# Patient Record
Sex: Female | Born: 1965 | Race: White | Hispanic: No | Smoking: Never smoker
Health system: Southern US, Community
[De-identification: ages and names within clinical notes are randomized; demographics above are authoritative.]

## PROBLEM LIST (undated history)

## (undated) DIAGNOSIS — E785 Hyperlipidemia, unspecified: Secondary | ICD-10-CM

## (undated) DIAGNOSIS — F909 Attention-deficit hyperactivity disorder, unspecified type: Secondary | ICD-10-CM

## (undated) DIAGNOSIS — I10 Essential (primary) hypertension: Secondary | ICD-10-CM

## (undated) DIAGNOSIS — K219 Gastro-esophageal reflux disease without esophagitis: Secondary | ICD-10-CM

## (undated) DIAGNOSIS — F32A Depression, unspecified: Secondary | ICD-10-CM

## (undated) DIAGNOSIS — I219 Acute myocardial infarction, unspecified: Secondary | ICD-10-CM

## (undated) DIAGNOSIS — F5081 Binge eating disorder: Secondary | ICD-10-CM

## (undated) DIAGNOSIS — N6091 Unspecified benign mammary dysplasia of right breast: Secondary | ICD-10-CM

## (undated) DIAGNOSIS — G43909 Migraine, unspecified, not intractable, without status migrainosus: Secondary | ICD-10-CM

## (undated) HISTORY — DX: Depression, unspecified: F32.A

## (undated) HISTORY — DX: Hyperlipidemia, unspecified: E78.5

## (undated) HISTORY — DX: Attention-deficit hyperactivity disorder, unspecified type: F90.9

## (undated) HISTORY — PX: BREAST EXCISIONAL BIOPSY: SUR124

## (undated) HISTORY — DX: Binge eating disorder: F50.81

## (undated) HISTORY — DX: Gastro-esophageal reflux disease without esophagitis: K21.9

## (undated) HISTORY — DX: Essential (primary) hypertension: I10

## (undated) HISTORY — PX: ABDOMINAL HYSTERECTOMY: SHX81

## (undated) HISTORY — PX: BREAST LUMPECTOMY: SHX2

## (undated) HISTORY — PX: LAPAROSCOPIC HYSTERECTOMY: SHX1926

## (undated) HISTORY — DX: Acute myocardial infarction, unspecified: I21.9

---

## 2012-07-18 ENCOUNTER — Other Ambulatory Visit: Payer: Self-pay | Admitting: Obstetrics and Gynecology

## 2012-07-18 DIAGNOSIS — R928 Other abnormal and inconclusive findings on diagnostic imaging of breast: Secondary | ICD-10-CM

## 2012-07-29 ENCOUNTER — Other Ambulatory Visit: Payer: Self-pay | Admitting: Obstetrics and Gynecology

## 2012-07-29 ENCOUNTER — Ambulatory Visit
Admission: RE | Admit: 2012-07-29 | Discharge: 2012-07-29 | Disposition: A | Discharge status: Home / Self Care | Payer: No Typology Code available for payment source | Source: Ambulatory Visit | Attending: Obstetrics and Gynecology | Admitting: Obstetrics and Gynecology

## 2012-07-29 DIAGNOSIS — R928 Other abnormal and inconclusive findings on diagnostic imaging of breast: Secondary | ICD-10-CM

## 2013-05-01 ENCOUNTER — Ambulatory Visit (HOSPITAL_COMMUNITY)
Admission: RE | Admit: 2013-05-01 | Discharge: 2013-05-01 | Disposition: A | Discharge status: Home / Self Care | Payer: No Typology Code available for payment source | Source: Ambulatory Visit | Attending: Obstetrics and Gynecology | Admitting: Obstetrics and Gynecology

## 2013-05-01 ENCOUNTER — Other Ambulatory Visit (HOSPITAL_COMMUNITY): Payer: Self-pay | Admitting: Obstetrics and Gynecology

## 2013-05-01 DIAGNOSIS — R079 Chest pain, unspecified: Secondary | ICD-10-CM

## 2013-05-01 DIAGNOSIS — R0602 Shortness of breath: Secondary | ICD-10-CM

## 2013-05-01 DIAGNOSIS — J984 Other disorders of lung: Secondary | ICD-10-CM | POA: Insufficient documentation

## 2013-05-01 DIAGNOSIS — Z90710 Acquired absence of both cervix and uterus: Secondary | ICD-10-CM | POA: Insufficient documentation

## 2013-05-01 DIAGNOSIS — J9819 Other pulmonary collapse: Secondary | ICD-10-CM | POA: Insufficient documentation

## 2015-02-17 HISTORY — PX: COLONOSCOPY: SHX174

## 2015-05-16 ENCOUNTER — Other Ambulatory Visit: Payer: Self-pay | Admitting: Obstetrics and Gynecology

## 2015-05-16 DIAGNOSIS — R928 Other abnormal and inconclusive findings on diagnostic imaging of breast: Secondary | ICD-10-CM

## 2015-05-31 ENCOUNTER — Ambulatory Visit
Admission: RE | Admit: 2015-05-31 | Discharge: 2015-05-31 | Disposition: A | Discharge status: Home / Self Care | Payer: BLUE CROSS/BLUE SHIELD | Source: Ambulatory Visit | Attending: Obstetrics and Gynecology | Admitting: Obstetrics and Gynecology

## 2015-05-31 ENCOUNTER — Other Ambulatory Visit: Payer: Self-pay | Admitting: Obstetrics and Gynecology

## 2015-05-31 DIAGNOSIS — R928 Other abnormal and inconclusive findings on diagnostic imaging of breast: Secondary | ICD-10-CM

## 2015-06-11 ENCOUNTER — Other Ambulatory Visit: Payer: BLUE CROSS/BLUE SHIELD

## 2015-06-12 ENCOUNTER — Other Ambulatory Visit: Payer: Self-pay | Admitting: Obstetrics and Gynecology

## 2015-06-12 ENCOUNTER — Ambulatory Visit
Admission: RE | Admit: 2015-06-12 | Discharge: 2015-06-12 | Disposition: A | Discharge status: Home / Self Care | Payer: BLUE CROSS/BLUE SHIELD | Source: Ambulatory Visit | Attending: Obstetrics and Gynecology | Admitting: Obstetrics and Gynecology

## 2015-06-12 DIAGNOSIS — R928 Other abnormal and inconclusive findings on diagnostic imaging of breast: Secondary | ICD-10-CM

## 2015-06-17 DIAGNOSIS — N6091 Unspecified benign mammary dysplasia of right breast: Secondary | ICD-10-CM

## 2015-06-17 HISTORY — DX: Unspecified benign mammary dysplasia of right breast: N60.91

## 2015-06-25 ENCOUNTER — Other Ambulatory Visit: Payer: Self-pay | Admitting: Surgery

## 2015-06-25 DIAGNOSIS — N6091 Unspecified benign mammary dysplasia of right breast: Secondary | ICD-10-CM

## 2015-06-26 ENCOUNTER — Other Ambulatory Visit: Payer: Self-pay | Admitting: Surgery

## 2015-06-26 DIAGNOSIS — N6091 Unspecified benign mammary dysplasia of right breast: Secondary | ICD-10-CM

## 2015-07-12 ENCOUNTER — Encounter (HOSPITAL_BASED_OUTPATIENT_CLINIC_OR_DEPARTMENT_OTHER): Payer: Self-pay | Admitting: *Deleted

## 2015-07-17 ENCOUNTER — Ambulatory Visit
Admission: RE | Admit: 2015-07-17 | Discharge: 2015-07-17 | Disposition: A | Discharge status: Home / Self Care | Payer: BLUE CROSS/BLUE SHIELD | Source: Ambulatory Visit | Attending: Surgery | Admitting: Surgery

## 2015-07-17 DIAGNOSIS — N6091 Unspecified benign mammary dysplasia of right breast: Secondary | ICD-10-CM

## 2015-07-17 NOTE — Progress Notes (Signed)
Pt given 8oz orange flavored Boost drink per protocol. Written and verbal instruction provided. Pt to complete drink by 0415 on DOS. Pt verbalized understanding.

## 2015-07-18 NOTE — Anesthesia Preprocedure Evaluation (Addendum)
Anesthesia Evaluation  Patient identified by MRN, date of birth, ID band Patient awake    Reviewed: Allergy & Precautions, NPO status , Patient's Chart, lab work & pertinent test results  Airway Mallampati: II  TM Distance: >3 FB Neck ROM: Full    Dental no notable dental hx.    Pulmonary neg pulmonary ROS,    Pulmonary exam normal breath sounds clear to auscultation       Cardiovascular negative cardio ROS Normal cardiovascular exam Rhythm:Regular Rate:Normal     Neuro/Psych negative neurological ROS  negative psych ROS   GI/Hepatic negative GI ROS, Neg liver ROS,   Endo/Other  negative endocrine ROS  Renal/GU negative Renal ROS  negative genitourinary   Musculoskeletal negative musculoskeletal ROS (+)   Abdominal   Peds negative pediatric ROS (+)  Hematology negative hematology ROS (+)   Anesthesia Other Findings   Reproductive/Obstetrics negative OB ROS                             Anesthesia Physical Anesthesia Plan  ASA: I  Anesthesia Plan: General   Post-op Pain Management:    Induction: Intravenous  Airway Management Planned: LMA  Additional Equipment:   Intra-op Plan:   Post-operative Plan: Extubation in OR  Informed Consent: I have reviewed the patients History and Physical, chart, labs and discussed the procedure including the risks, benefits and alternatives for the proposed anesthesia with the patient or authorized representative who has indicated his/her understanding and acceptance.   Dental advisory given  Plan Discussed with: CRNA and Surgeon  Anesthesia Plan Comments:         Anesthesia Quick Evaluation  

## 2015-07-18 NOTE — H&P (Signed)
Miranda Huff 06/25/2015 2:44 PM Location: [facility] Patient #: [id number] DOB: 03/27/1965 Single / Language: Cleophus Molt / Race: White Female   History of Present Illness (Alize Borrayo A. Ninfa Linden MD; 06/25/2015 2:59 PM) Patient words: New-breast.  The patient is a 50 year old female who presents with a complaint of Breast problems. This is a pleasant female referred by Dr. Marin Olp after the recent diagnosis of atypical lobular hyperplasia of the right breast. This was found on recent screening mammography. She actually had a small mass which the radiologist could palpate. Mammogram and ultrasound were performed as well as a biopsy showing atypical lobular hyperplasia. She has not been able to palpate the mass herself. She has not had any previous breast biopsy. She denies nipple discharge. There is no family history of breast cancer. She is otherwise healthy and without complaints.   Other Problems Malachi Bonds, CMA; 06/25/2015 2:44 PM) High blood pressure Kidney Stone Migraine Headache  Past Surgical History Malachi Bonds, CMA; 06/25/2015 2:44 PM) Breast Biopsy Right. Hysterectomy (not due to cancer) - Partial Oral Surgery  Diagnostic Studies History Malachi Bonds, CMA; 06/25/2015 2:44 PM) Colonoscopy never Mammogram within last year Pap Smear 1-5 years ago  Allergies Malachi Bonds, CMA; 06/25/2015 2:45 PM) No Known Drug Allergies05/10/2015  Medication History Malachi Bonds, CMA; 06/25/2015 2:45 PM) Topiramate (25MG  Tablet, Oral) Active. Medications Reconciled  Social History Malachi Bonds, CMA; 06/25/2015 2:44 PM) Alcohol use Remotely quit alcohol use. Caffeine use Coffee. No drug use Tobacco use Never smoker.  Family History Malachi Bonds, [place]; 06/25/2015 2:44 PM) Alcohol Abuse Sister. Cancer Mother. Depression Brother, Mother, Sister. Hypertension Brother, Mother, Sister. Migraine Headache Son. Thyroid problems  Mother.  Pregnancy / Birth History Malachi Bonds, CMA; 06/25/2015 2:44 PM) Age at menarche 34 years. Age of menopause 35-50 Gravida 2 Irregular periods Maternal age 24-20 Para 2    Review of Systems Malachi Bonds CMA; 06/25/2015 2:44 PM) General Present- Night Sweats. Not Present- Appetite Loss, Chills, Fatigue, Fever, Weight Gain and Weight Loss. Skin Not Present- Change in Wart/Mole, Dryness, Hives, Jaundice, New Lesions, Non-Healing Wounds, Rash and Ulcer. HEENT Not Present- Earache, Hearing Loss, Hoarseness, Nose Bleed, Oral Ulcers, Ringing in the Ears, Seasonal Allergies, Sinus Pain, Sore Throat, Visual Disturbances, Wears glasses/contact lenses and Yellow Eyes. Respiratory Not Present- Bloody sputum, Chronic Cough, Difficulty Breathing, Snoring and Wheezing. Breast Present- Breast Mass. Not Present- Breast Pain, Nipple Discharge and Skin Changes. Cardiovascular Not Present- Chest Pain, Difficulty Breathing Lying Down, Leg Cramps, Palpitations, Rapid Heart Rate, Shortness of Breath and Swelling of Extremities. Gastrointestinal Not Present- Abdominal Pain, Bloating, Bloody Stool, Change in Bowel Habits, Chronic diarrhea, Constipation, Difficulty Swallowing, Excessive gas, Gets full quickly at meals, Hemorrhoids, Indigestion, Nausea, Rectal Pain and Vomiting. Female Genitourinary Not Present- Frequency, Nocturia, Painful Urination, Pelvic Pain and Urgency. Musculoskeletal Not Present- Back Pain, Joint Pain, Joint Stiffness, Muscle Pain, Muscle Weakness and Swelling of Extremities. Neurological Not Present- Decreased Memory, Fainting, Headaches, Numbness, Seizures, Tingling, Tremor, Trouble walking and Weakness. Psychiatric Not Present- Anxiety, Bipolar, Change in Sleep Pattern, Depression, Fearful and Frequent crying. Endocrine Present- Hot flashes. Not Present- Cold Intolerance, Excessive Hunger, Hair Changes, Heat Intolerance and New Diabetes. Hematology Not Present- Easy Bruising,  Excessive bleeding, Gland problems, HIV and Persistent Infections.  Vitals (Chemira Jones CMA; 06/25/2015 2:45 PM) 06/25/2015 2:44 PM Weight: 157.2 lb Height: 66in Body Surface Area: 1.81 m Body Mass Index: 25.37 kg/m  Temp.: 98.66F(Oral)  Pulse: 82 (Regular)  BP: 150/90 (Sitting, Left Arm, Standard)  Physical Exam (Somer Trotter A. Ninfa Linden MD; 06/25/2015 3:00 PM) General Mental Status-Alert. General Appearance-Consistent with stated age. Hydration-Well hydrated. Voice-Normal.  Head and Neck Head-normocephalic, atraumatic with no lesions or palpable masses. Trachea-midline. Thyroid Gland Characteristics - normal size and consistency.  Eye Eyeball - Bilateral-Extraocular movements intact. Sclera/Conjunctiva - Bilateral-No scleral icterus.  Chest and Lung Exam Chest and lung exam reveals -quiet, even and easy respiratory effort with no use of accessory muscles and on auscultation, normal breath sounds, no adventitious sounds and normal vocal resonance. Inspection Chest Wall - Normal. Back - normal.  Breast Breast - Left-Symmetric, No Dimpling, No Inflammation, No Lumpectomy scars, No Mastectomy scars, No Peau d' Orange. Breast - Right-Symmetric and Biopsy scar, Non Tender, No Dimpling, No Inflammation, No Lumpectomy scars, No Mastectomy scars, No Peau d' Orange. Note: There is bruising of the right breast at the biopsy site. I cannot palpate the breast mass at the 10 o'clock position. Breast Lump-No Palpable Breast Mass.  Cardiovascular Cardiovascular examination reveals -normal heart sounds, regular rate and rhythm with no murmurs and normal pedal pulses bilaterally.  Abdomen Inspection Inspection of the abdomen reveals - No Hernias. Skin - Scar - no surgical scars. Palpation/Percussion Palpation and Percussion of the abdomen reveal - Soft, Non Tender, No Rebound tenderness, No Rigidity (guarding) and No  hepatosplenomegaly. Auscultation Auscultation of the abdomen reveals - Bowel sounds normal.  Neurologic - Did not examine.  Musculoskeletal Normal Exam - Left-Upper Extremity Strength Normal and Lower Extremity Strength Normal. Normal Exam - Right-Upper Extremity Strength Normal and Lower Extremity Strength Normal.  Lymphatic Head & Neck  General Head & Neck Lymphatics: Bilateral - Description - Normal. Axillary  General Axillary Region: Bilateral - Description - Normal. Tenderness - Non Tender. Femoral & Inguinal  Generalized Femoral & Inguinal Lymphatics: Bilateral - Description - Normal. Tenderness - Non Tender.    Assessment & Plan (Tetsuo Coppola A. Ninfa Linden MD; 06/25/2015 3:01 PM) ATYPICAL LOBULAR HYPERPLASIA OF RIGHT BREAST (N60.91) Impression: I discussed the pathology with her and gave her a copy of the pathology report. Radioactive seed right breast lumpectomy is recommended for complete histologic evaluation to rule out malignancy. I discussed the reasons for this with her in detail. I discussed the risks of surgery which includes but is not limited to bleeding, infection, need for further surgery if malignancy is found, etc. I also discussed postoperative recovery. She understands and wishes to proceed with surgery which will be scheduled

## 2015-07-19 ENCOUNTER — Ambulatory Visit
Admission: RE | Admit: 2015-07-19 | Discharge: 2015-07-19 | Disposition: A | Discharge status: Home / Self Care | Payer: BLUE CROSS/BLUE SHIELD | Source: Ambulatory Visit | Attending: Surgery | Admitting: Surgery

## 2015-07-19 ENCOUNTER — Ambulatory Visit (HOSPITAL_BASED_OUTPATIENT_CLINIC_OR_DEPARTMENT_OTHER): Payer: BLUE CROSS/BLUE SHIELD | Admitting: Anesthesiology

## 2015-07-19 ENCOUNTER — Encounter (HOSPITAL_BASED_OUTPATIENT_CLINIC_OR_DEPARTMENT_OTHER): Payer: Self-pay | Admitting: Certified Registered"

## 2015-07-19 ENCOUNTER — Encounter (HOSPITAL_BASED_OUTPATIENT_CLINIC_OR_DEPARTMENT_OTHER)
Admission: RE | Disposition: A | Discharge status: Home / Self Care | Payer: Self-pay | Source: Ambulatory Visit | Attending: Surgery

## 2015-07-19 ENCOUNTER — Ambulatory Visit (HOSPITAL_BASED_OUTPATIENT_CLINIC_OR_DEPARTMENT_OTHER)
Admission: RE | Admit: 2015-07-19 | Discharge: 2015-07-19 | Disposition: A | Discharge status: Home / Self Care | Payer: BLUE CROSS/BLUE SHIELD | Source: Ambulatory Visit | Attending: Surgery | Admitting: Surgery

## 2015-07-19 DIAGNOSIS — N62 Hypertrophy of breast: Secondary | ICD-10-CM | POA: Diagnosis not present

## 2015-07-19 DIAGNOSIS — N6091 Unspecified benign mammary dysplasia of right breast: Secondary | ICD-10-CM

## 2015-07-19 HISTORY — DX: Migraine, unspecified, not intractable, without status migrainosus: G43.909

## 2015-07-19 HISTORY — PX: BREAST LUMPECTOMY WITH RADIOACTIVE SEED LOCALIZATION: SHX6424

## 2015-07-19 HISTORY — DX: Unspecified benign mammary dysplasia of right breast: N60.91

## 2015-07-19 SURGERY — BREAST LUMPECTOMY WITH RADIOACTIVE SEED LOCALIZATION
Anesthesia: General | Site: Breast | Laterality: Right

## 2015-07-19 MED ORDER — FENTANYL CITRATE (PF) 100 MCG/2ML IJ SOLN
25.0000 ug | INTRAMUSCULAR | Status: DC | PRN
  Administered 2015-07-19 (×2): 50 ug via INTRAVENOUS

## 2015-07-19 MED ORDER — GLYCOPYRROLATE 0.2 MG/ML IJ SOLN: 0.2000 mg | Freq: Once | INTRAMUSCULAR | Status: DC | PRN

## 2015-07-19 MED ORDER — MORPHINE SULFATE (PF) 2 MG/ML IV SOLN: 1.0000 mg | INTRAVENOUS | Status: DC | PRN

## 2015-07-19 MED ORDER — PROPOFOL 500 MG/50ML IV EMUL
INTRAVENOUS | Status: AC
  Filled 2015-07-19: qty 50

## 2015-07-19 MED ORDER — KETOROLAC TROMETHAMINE 30 MG/ML IJ SOLN
30.0000 mg | Freq: Once | INTRAMUSCULAR | Status: DC | PRN
Start: 2015-07-19 — End: 2015-07-19

## 2015-07-19 MED ORDER — DEXAMETHASONE SODIUM PHOSPHATE 4 MG/ML IJ SOLN
INTRAMUSCULAR | Status: DC | PRN
  Administered 2015-07-19: 10 mg via INTRAVENOUS

## 2015-07-19 MED ORDER — BUPIVACAINE-EPINEPHRINE (PF) 0.5% -1:200000 IJ SOLN
INTRAMUSCULAR | Status: AC
  Filled 2015-07-19: qty 30

## 2015-07-19 MED ORDER — ACETAMINOPHEN 650 MG RE SUPP: 650.0000 mg | RECTAL | Status: DC | PRN

## 2015-07-19 MED ORDER — MIDAZOLAM HCL 2 MG/2ML IJ SOLN
1.0000 mg | INTRAMUSCULAR | Status: DC | PRN
  Administered 2015-07-19: 2 mg via INTRAVENOUS

## 2015-07-19 MED ORDER — BUPIVACAINE HCL (PF) 0.25 % IJ SOLN
INTRAMUSCULAR | Status: AC
  Filled 2015-07-19: qty 30

## 2015-07-19 MED ORDER — BUPIVACAINE-EPINEPHRINE 0.5% -1:200000 IJ SOLN
INTRAMUSCULAR | Status: DC | PRN
  Administered 2015-07-19: 20 mL

## 2015-07-19 MED ORDER — METHYLENE BLUE 0.5 % INJ SOLN
INTRAVENOUS | Status: AC
  Filled 2015-07-19: qty 10

## 2015-07-19 MED ORDER — LIDOCAINE HCL (CARDIAC) 20 MG/ML IV SOLN
INTRAVENOUS | Status: DC | PRN
  Administered 2015-07-19: 60 mg via INTRAVENOUS

## 2015-07-19 MED ORDER — SODIUM CHLORIDE 0.9 % IJ SOLN
INTRAMUSCULAR | Status: AC
  Filled 2015-07-19: qty 10

## 2015-07-19 MED ORDER — ONDANSETRON HCL 4 MG/2ML IJ SOLN
INTRAMUSCULAR | Status: AC
  Filled 2015-07-19: qty 2

## 2015-07-19 MED ORDER — PROMETHAZINE HCL 25 MG/ML IJ SOLN: 6.2500 mg | INTRAMUSCULAR | Status: DC | PRN

## 2015-07-19 MED ORDER — LIDOCAINE 2% (20 MG/ML) 5 ML SYRINGE
INTRAMUSCULAR | Status: AC
  Filled 2015-07-19: qty 5

## 2015-07-19 MED ORDER — ONDANSETRON HCL 4 MG/2ML IJ SOLN
INTRAMUSCULAR | Status: DC | PRN
  Administered 2015-07-19: 4 mg via INTRAVENOUS

## 2015-07-19 MED ORDER — SODIUM CHLORIDE 0.9% FLUSH: 3.0000 mL | INTRAVENOUS | Status: DC | PRN

## 2015-07-19 MED ORDER — SODIUM CHLORIDE 0.9 % IV SOLN: 250.0000 mL | INTRAVENOUS | Status: DC | PRN

## 2015-07-19 MED ORDER — FENTANYL CITRATE (PF) 100 MCG/2ML IJ SOLN
INTRAMUSCULAR | Status: AC
  Filled 2015-07-19: qty 2

## 2015-07-19 MED ORDER — OXYCODONE-ACETAMINOPHEN 5-325 MG PO TABS: 1.0000 | ORAL_TABLET | ORAL | Status: AC | PRN

## 2015-07-19 MED ORDER — MIDAZOLAM HCL 2 MG/2ML IJ SOLN
INTRAMUSCULAR | Status: AC
  Filled 2015-07-19: qty 2

## 2015-07-19 MED ORDER — LACTATED RINGERS IV SOLN
INTRAVENOUS | Status: DC
  Administered 2015-07-19 (×2): via INTRAVENOUS

## 2015-07-19 MED ORDER — ACETAMINOPHEN 325 MG PO TABS: 650.0000 mg | ORAL_TABLET | ORAL | Status: DC | PRN

## 2015-07-19 MED ORDER — SODIUM CHLORIDE 0.9% FLUSH
3.0000 mL | Freq: Two times a day (BID) | INTRAVENOUS | Status: DC
Start: 2015-07-19 — End: 2015-07-19

## 2015-07-19 MED ORDER — DEXAMETHASONE SODIUM PHOSPHATE 10 MG/ML IJ SOLN
INTRAMUSCULAR | Status: AC
  Filled 2015-07-19: qty 1

## 2015-07-19 MED ORDER — CEFAZOLIN SODIUM-DEXTROSE 2-4 GM/100ML-% IV SOLN
INTRAVENOUS | Status: AC
Start: 2015-07-19 — End: 2015-07-19
  Filled 2015-07-19: qty 100

## 2015-07-19 MED ORDER — CEFAZOLIN SODIUM-DEXTROSE 2-4 GM/100ML-% IV SOLN
2.0000 g | INTRAVENOUS | Status: AC
  Administered 2015-07-19: 2 g via INTRAVENOUS

## 2015-07-19 MED ORDER — PROPOFOL 10 MG/ML IV BOLUS
INTRAVENOUS | Status: DC | PRN
  Administered 2015-07-19: 200 mg via INTRAVENOUS

## 2015-07-19 MED ORDER — FENTANYL CITRATE (PF) 100 MCG/2ML IJ SOLN
50.0000 ug | INTRAMUSCULAR | Status: DC | PRN
  Administered 2015-07-19 (×2): 50 ug via INTRAVENOUS

## 2015-07-19 MED ORDER — OXYCODONE HCL 5 MG PO TABS: 5.0000 mg | ORAL_TABLET | ORAL | Status: DC | PRN

## 2015-07-19 MED ORDER — SCOPOLAMINE 1 MG/3DAYS TD PT72: 1.0000 | MEDICATED_PATCH | Freq: Once | TRANSDERMAL | Status: DC | PRN

## 2015-07-19 MED FILL — OXYCODONE/APAP 5-325: 5-325 | 3 days supply | Qty: 30 | Fill #0

## 2015-07-19 SURGICAL SUPPLY — 45 items
APPLIER CLIP 9.375 MED OPEN (MISCELLANEOUS)
BLADE HEX COATED 2.75 (ELECTRODE) ×3 IMPLANT
BLADE SURG 15 STRL LF DISP TIS (BLADE) ×1 IMPLANT
BLADE SURG 15 STRL SS (BLADE) ×2
CANISTER SUCT 1200ML W/VALVE (MISCELLANEOUS) IMPLANT
CHLORAPREP W/TINT 26ML (MISCELLANEOUS) ×3 IMPLANT
CLIP APPLIE 9.375 MED OPEN (MISCELLANEOUS) IMPLANT
CLIP TI WIDE RED SMALL 6 (CLIP) IMPLANT
COVER BACK TABLE 60X90IN (DRAPES) ×3 IMPLANT
COVER MAYO STAND STRL (DRAPES) ×3 IMPLANT
COVER PROBE W GEL 5X96 (DRAPES) ×3 IMPLANT
DECANTER SPIKE VIAL GLASS SM (MISCELLANEOUS) IMPLANT
DEVICE DUBIN W/COMP PLATE 8390 (MISCELLANEOUS) ×3 IMPLANT
DRAPE LAPAROTOMY 100X72 PEDS (DRAPES) ×3 IMPLANT
DRAPE UTILITY XL STRL (DRAPES) ×3 IMPLANT
ELECT REM PT RETURN 9FT ADLT (ELECTROSURGICAL) ×3
ELECTRODE REM PT RTRN 9FT ADLT (ELECTROSURGICAL) ×1 IMPLANT
GLOVE BIOGEL PI IND STRL 7.0 (GLOVE) ×3 IMPLANT
GLOVE BIOGEL PI INDICATOR 7.0 (GLOVE) ×6
GLOVE ECLIPSE 6.5 STRL STRAW (GLOVE) ×3 IMPLANT
GLOVE SURG SIGNA 7.5 PF LTX (GLOVE) ×3 IMPLANT
GLOVE SURG SS PI 7.0 STRL IVOR (GLOVE) ×3 IMPLANT
GOWN STRL REUS W/ TWL LRG LVL3 (GOWN DISPOSABLE) ×3 IMPLANT
GOWN STRL REUS W/ TWL XL LVL3 (GOWN DISPOSABLE) IMPLANT
GOWN STRL REUS W/TWL LRG LVL3 (GOWN DISPOSABLE) ×6
GOWN STRL REUS W/TWL XL LVL3 (GOWN DISPOSABLE)
KIT MARKER MARGIN INK (KITS) ×3 IMPLANT
LIQUID BAND (GAUZE/BANDAGES/DRESSINGS) ×3 IMPLANT
NEEDLE HYPO 25X1 1.5 SAFETY (NEEDLE) ×3 IMPLANT
NS IRRIG 1000ML POUR BTL (IV SOLUTION) IMPLANT
PACK BASIN DAY SURGERY FS (CUSTOM PROCEDURE TRAY) ×3 IMPLANT
PENCIL BUTTON HOLSTER BLD 10FT (ELECTRODE) ×3 IMPLANT
SLEEVE SCD COMPRESS KNEE MED (MISCELLANEOUS) ×3 IMPLANT
SPONGE GAUZE 4X4 12PLY STER LF (GAUZE/BANDAGES/DRESSINGS) IMPLANT
SPONGE LAP 4X18 X RAY DECT (DISPOSABLE) ×3 IMPLANT
SUT MNCRL AB 4-0 PS2 18 (SUTURE) ×3 IMPLANT
SUT SILK 2 0 SH (SUTURE) IMPLANT
SUT VIC AB 3-0 SH 27 (SUTURE) ×2
SUT VIC AB 3-0 SH 27X BRD (SUTURE) ×1 IMPLANT
SYR CONTROL 10ML LL (SYRINGE) ×3 IMPLANT
TOWEL OR 17X24 6PK STRL BLUE (TOWEL DISPOSABLE) ×3 IMPLANT
TOWEL OR NON WOVEN STRL DISP B (DISPOSABLE) IMPLANT
TUBE CONNECTING 20'X1/4 (TUBING)
TUBE CONNECTING 20X1/4 (TUBING) IMPLANT
YANKAUER SUCT BULB TIP NO VENT (SUCTIONS) IMPLANT

## 2015-07-19 NOTE — Anesthesia Procedure Notes (Signed)
Procedure Name: LMA Insertion Date/Time: 07/19/2015 7:30 AM Performed by: Elizeo Rodriques D Pre-anesthesia Checklist: Patient identified, Emergency Drugs available, Suction available and Patient being monitored Patient Re-evaluated:Patient Re-evaluated prior to inductionOxygen Delivery Method: Circle system utilized Preoxygenation: Pre-oxygenation with 100% oxygen Intubation Type: IV induction Ventilation: Mask ventilation without difficulty LMA: LMA inserted LMA Size: 4.0 Number of attempts: 1 Airway Equipment and Method: Bite block Placement Confirmation: positive ETCO2 Tube secured with: Tape Dental Injury: Teeth and Oropharynx as per pre-operative assessment

## 2015-07-19 NOTE — Transfer of Care (Signed)
Immediate Anesthesia Transfer of Care Note  Patient: Miranda Huff  Procedure(s) Performed: Procedure(s): RIGHT BREAST LUMPECTOMY WITH RADIOACTIVE SEED LOCALIZATION (Right)  Patient Location: PACU  Anesthesia Type:General  Level of Consciousness: awake, alert , oriented and patient cooperative  Airway & Oxygen Therapy: Patient Spontanous Breathing and Patient connected to face mask oxygen  Post-op Assessment: Report given to RN and Post -op Vital signs reviewed and stable  Post vital signs: Reviewed and stable  Last Vitals:  Filed Vitals:   07/19/15 0646  BP: 159/97  Pulse: 71  Temp: 36.5 C  Resp: 18    Last Pain: There were no vitals filed for this visit.       Complications: No apparent anesthesia complications

## 2015-07-19 NOTE — Anesthesia Postprocedure Evaluation (Signed)
Anesthesia Post Note  Patient: Miranda Huff  Procedure(s) Performed: Procedure(s) (LRB): RIGHT BREAST LUMPECTOMY WITH RADIOACTIVE SEED LOCALIZATION (Right)  Patient location during evaluation: PACU Anesthesia Type: General Level of consciousness: awake and alert Pain management: pain level controlled Vital Signs Assessment: post-procedure vital signs reviewed and stable Respiratory status: spontaneous breathing, nonlabored ventilation, respiratory function stable and patient connected to nasal cannula oxygen Cardiovascular status: blood pressure returned to baseline and stable Postop Assessment: no signs of nausea or vomiting Anesthetic complications: no    Last Vitals:  Filed Vitals:   07/19/15 0809 07/19/15 0810  BP:  134/95  Pulse: 97 98  Temp: 36.8 C   Resp: 18 30    Last Pain:  Filed Vitals:   07/19/15 0816  PainSc: Asleep                 Syniah Berne S

## 2015-07-19 NOTE — Op Note (Signed)
RIGHT BREAST LUMPECTOMY WITH RADIOACTIVE SEED LOCALIZATION  Procedure Note  Miranda Huff 07/19/2015   Pre-op Diagnosis: Atypical lobular hyperplasia right breast      Post-op Diagnosis: same  Procedure(s): RIGHT BREAST LUMPECTOMY WITH RADIOACTIVE SEED LOCALIZATION  Surgeon(s): Coralie Keens, MD  Anesthesia: General  Staff:  Circulator: Izora Ribas, RN Scrub Person: Rhea Pink Neiers, CST  Estimated Blood Loss: Minimal               Specimens: sent to path          [facility] A   Date: 07/19/2015  Time: 8:05 AM

## 2015-07-19 NOTE — Op Note (Signed)
NAME:  Miranda Huff, Miranda Huff NO.:  [id number]  MEDICAL RECORD NO.:  [id number]  LOCATION:                                 FACILITY:  PHYSICIAN:  Coralie Keens, M.D. DATE OF BIRTH:  [birth year]  DATE OF PROCEDURE:  07/19/2015 DATE OF DISCHARGE:                              OPERATIVE REPORT   PREOPERATIVE DIAGNOSIS:  Atypical lobular hyperplasia of the right breast.  POSTOPERATIVE DIAGNOSIS:  Atypical lobular hyperplasia of the right breast.  PROCEDURE:  Radioactive seed localized right breast lumpectomy.  SURGEON:  Coralie Keens, M.D.  ANESTHESIA:  General and 0.5% Marcaine with epinephrine.  ESTIMATED BLOOD LOSS:  Minimal.  INDICATIONS:  This is a 50 year old female who was found to have an abnormality on the right breast on screening mammography.  Stereotactic biopsy was performed showing atypical lobular hyperplasia.  Decision was made to proceed with excision of this area under radioactive seed localization.  FINDINGS:  The radioactive seed was found to be in the lumpectomy specimen.  I had to take more lateral tissue to incorporate the previously placed biopsy clip.  PROCEDURE IN DETAIL:  The patient was brought to the operating room, identified as [name].  She was placed supine on the operating room table and general anesthesia was induced.  Her right breast was then prepped and draped in usual sterile fashion.  I had already confirmed that the seed was in place in the preoperative holding area. I then used a Neoprobe to again identify the area at the 10 o'clock position of the upper outer quadrant of the right breast.  I anesthetized the skin around the lateral edge of the areola and made a circumareolar incision with a scalpel.  I then took this down into the breast tissue with electrocautery.  With the aid of the Neoprobe, I performed a lumpectomy with the electrocautery going what appeared to be widely around the marker.  Once this  area was removed, I again confirmed the radioactive seed was in the specimen with the Neoprobe.  I then painted all margins with a marker paint.  An x-ray was then performed and confirmed that the radioactive seed was in place but the previous marker was not.  I determined the mark was actually slightly more laterally.  I was able to actually visualize this in the lumpectomy cavity and take more lateral margin.  I then x-rayed this area and confirmed that the previously placed marker was in the breast specimen. All the breast tissue was sent to Pathology for evaluation.  Again, this includes the lumpectomy as well as further lateral margin.  I then achieved hemostasis with cautery.  I anesthetized the wound further with Marcaine.  I then closed the subcutaneous tissue with interrupted 3-0 Vicryl sutures and closed the skin with a running 4-0 Monocryl.  Skin glue was then applied.  The patient tolerated the procedure well.  All the counts were correct at the end of the procedure.  The patient was then extubated in the operating room and taken in a stable condition to the recovery room.     Coralie Keens, M.D.   ______________________________ Coralie Keens, M.D.    DB/MEDQ  D:  07/19/2015  T:  07/19/2015  Job:  [id number]

## 2015-07-19 NOTE — Discharge Instructions (Signed)
[facility] °Office Phone Number [phone] ° °BREAST BIOPSY/ PARTIAL MASTECTOMY: POST OP INSTRUCTIONS ° °Always review your discharge instruction sheet given to you by the facility where your surgery was performed. ° °IF YOU HAVE DISABILITY OR FAMILY LEAVE FORMS, YOU MUST BRING THEM TO THE OFFICE FOR PROCESSING.  DO NOT GIVE THEM TO YOUR DOCTOR. ° °1. A prescription for pain medication may be given to you upon discharge.  Take your pain medication as prescribed, if needed.  If narcotic pain medicine is not needed, then you may take acetaminophen (Tylenol) or ibuprofen (Advil) as needed. °2. Take your usually prescribed medications unless otherwise directed °3. If you need a refill on your pain medication, please contact your pharmacy.  They will contact our office to request authorization.  Prescriptions will not be filled after 5pm or on week-ends. °4. You should eat very light the first 24 hours after surgery, such as soup, crackers, pudding, etc.  Resume your normal diet the day after surgery. °5. Most patients will experience some swelling and bruising in the breast.  Ice packs and a good support bra will help.  Swelling and bruising can take several days to resolve.  °6. It is common to experience some constipation if taking pain medication after surgery.  Increasing fluid intake and taking a stool softener will usually help or prevent this problem from occurring.  A mild laxative (Milk of Magnesia or Miralax) should be taken according to package directions if there are no bowel movements after 48 hours. °7. Unless discharge instructions indicate otherwise, you may remove your bandages 24-48 hours after surgery, and you may shower at that time.  You may have steri-strips (small skin tapes) in place directly over the incision.  These strips should be left on the skin for 7-10 days.  If your surgeon used skin glue on the incision, you may shower in 24 hours.  The glue will flake off over the  next 2-3 weeks.  Any sutures or staples will be removed at the office during your follow-up visit. °8. ACTIVITIES:  You may resume regular daily activities (gradually increasing) beginning the next day.  Wearing a good support bra or sports bra minimizes pain and swelling.  You may have sexual intercourse when it is comfortable. °a. You may drive when you no longer are taking prescription pain medication, you can comfortably wear a seatbelt, and you can safely maneuver your car and apply brakes. °b. RETURN TO WORK:  ______________________________________________________________________________________ °9. You should see your doctor in the office for a follow-up appointment approximately two weeks after your surgery.  Your doctor’s nurse will typically make your follow-up appointment when she calls you with your pathology report.  Expect your pathology report 2-3 business days after your surgery.  You may call to check if you do not hear from us after three days. °10. OTHER INSTRUCTIONS: _______________________________________________________________________________________________ _____________________________________________________________________________________________________________________________________ °_____________________________________________________________________________________________________________________________________ °_____________________________________________________________________________________________________________________________________ ° °WHEN TO CALL YOUR DOCTOR: °1. Fever over 101.0 °2. Nausea and/or vomiting. °3. Extreme swelling or bruising. °4. Continued bleeding from incision. °5. Increased pain, redness, or drainage from the incision. ° °The clinic staff is available to answer your questions during regular business hours.  Please don’t hesitate to call and ask to speak to one of the nurses for clinical concerns.  If you have a medical emergency, go to the nearest  emergency room or call 911.  A surgeon from [facility] is always on call at the hospital. ° °For further questions, please visit [email or web address]  ° ° ° °  Post Anesthesia Home Care Instructions ° °Activity: °Get plenty of rest for the remainder of the day. A responsible adult should stay with you for 24 hours following the procedure.  °For the next 24 hours, DO NOT: °-Drive a car °-Operate machinery °-Drink alcoholic beverages °-Take any medication unless instructed by your physician °-Make any legal decisions or sign important papers. ° °Meals: °Start with liquid foods such as gelatin or soup. Progress to regular foods as tolerated. Avoid greasy, spicy, heavy foods. If nausea and/or vomiting occur, drink only clear liquids until the nausea and/or vomiting subsides. Call your physician if vomiting continues. ° °Special Instructions/Symptoms: °Your throat may feel dry or sore from the anesthesia or the breathing tube placed in your throat during surgery. If this causes discomfort, gargle with warm salt water. The discomfort should disappear within 24 hours. ° °If you had a scopolamine patch placed behind your ear for the management of post- operative nausea and/or vomiting: ° °1. The medication in the patch is effective for 72 hours, after which it should be removed.  Wrap patch in a tissue and discard in the trash. Wash hands thoroughly with soap and water. °2. You may remove the patch earlier than 72 hours if you experience unpleasant side effects which may include dry mouth, dizziness or visual disturbances. °3. Avoid touching the patch. Wash your hands with soap and water after contact with the patch. °  ° °

## 2015-07-19 NOTE — Interval H&P Note (Signed)
History and Physical Interval Note: no change in H and P  07/19/2015 6:45 AM  Miranda Huff  has presented today for surgery, with the diagnosis of Atypical lobular hyperplasia right breast   The various methods of treatment have been discussed with the patient and family. After consideration of risks, benefits and other options for treatment, the patient has consented to  Procedure(s): RIGHT BREAST LUMPECTOMY WITH RADIOACTIVE SEED LOCALIZATION (Right) as a surgical intervention .  The patient's history has been reviewed, patient examined, no change in status, stable for surgery.  I have reviewed the patient's chart and labs.  Questions were answered to the patient's satisfaction.     [name] A

## 2015-07-22 ENCOUNTER — Encounter (HOSPITAL_BASED_OUTPATIENT_CLINIC_OR_DEPARTMENT_OTHER): Payer: Self-pay | Admitting: Surgery

## 2016-05-14 ENCOUNTER — Other Ambulatory Visit: Payer: Self-pay | Admitting: Obstetrics and Gynecology

## 2016-05-14 DIAGNOSIS — R928 Other abnormal and inconclusive findings on diagnostic imaging of breast: Secondary | ICD-10-CM

## 2016-05-18 ENCOUNTER — Ambulatory Visit
Admission: RE | Admit: 2016-05-18 | Discharge: 2016-05-18 | Disposition: A | Discharge status: Home / Self Care | Payer: BLUE CROSS/BLUE SHIELD | Source: Ambulatory Visit | Attending: Obstetrics and Gynecology | Admitting: Obstetrics and Gynecology

## 2016-05-18 DIAGNOSIS — R928 Other abnormal and inconclusive findings on diagnostic imaging of breast: Secondary | ICD-10-CM

## 2017-05-31 ENCOUNTER — Other Ambulatory Visit: Payer: Self-pay | Admitting: Obstetrics and Gynecology

## 2017-05-31 DIAGNOSIS — R928 Other abnormal and inconclusive findings on diagnostic imaging of breast: Secondary | ICD-10-CM

## 2017-06-01 ENCOUNTER — Ambulatory Visit
Admission: RE | Admit: 2017-06-01 | Discharge: 2017-06-01 | Disposition: A | Discharge status: Home / Self Care | Payer: BLUE CROSS/BLUE SHIELD | Source: Ambulatory Visit | Attending: Obstetrics and Gynecology | Admitting: Obstetrics and Gynecology

## 2017-06-01 DIAGNOSIS — R928 Other abnormal and inconclusive findings on diagnostic imaging of breast: Secondary | ICD-10-CM

## 2017-06-20 ENCOUNTER — Other Ambulatory Visit: Payer: Self-pay | Admitting: Obstetrics and Gynecology

## 2017-06-20 DIAGNOSIS — Z803 Family history of malignant neoplasm of breast: Secondary | ICD-10-CM

## 2017-06-29 ENCOUNTER — Ambulatory Visit
Admission: RE | Admit: 2017-06-29 | Discharge: 2017-06-29 | Disposition: A | Discharge status: Home / Self Care | Payer: BLUE CROSS/BLUE SHIELD | Source: Ambulatory Visit | Attending: Obstetrics and Gynecology | Admitting: Obstetrics and Gynecology

## 2017-06-29 ENCOUNTER — Encounter (INDEPENDENT_AMBULATORY_CARE_PROVIDER_SITE_OTHER): Payer: Self-pay

## 2017-06-29 DIAGNOSIS — Z803 Family history of malignant neoplasm of breast: Secondary | ICD-10-CM

## 2017-06-29 MED ORDER — GADOBENATE DIMEGLUMINE 529 MG/ML IV SOLN
14.0000 mL | Freq: Once | INTRAVENOUS | Status: AC | PRN
  Administered 2017-06-29: 14 mL via INTRAVENOUS

## 2017-07-15 ENCOUNTER — Emergency Department (HOSPITAL_COMMUNITY)
Admission: EM | Admit: 2017-07-15 | Discharge: 2017-07-15 | Disposition: A | Discharge status: Home / Self Care | Payer: BLUE CROSS/BLUE SHIELD | Attending: Emergency Medicine | Admitting: Emergency Medicine

## 2017-07-15 ENCOUNTER — Encounter (HOSPITAL_COMMUNITY): Payer: Self-pay | Admitting: Emergency Medicine

## 2017-07-15 ENCOUNTER — Emergency Department (HOSPITAL_COMMUNITY): Payer: BLUE CROSS/BLUE SHIELD

## 2017-07-15 DIAGNOSIS — R0602 Shortness of breath: Secondary | ICD-10-CM | POA: Diagnosis not present

## 2017-07-15 DIAGNOSIS — Z5321 Procedure and treatment not carried out due to patient leaving prior to being seen by health care provider: Secondary | ICD-10-CM | POA: Insufficient documentation

## 2017-07-15 DIAGNOSIS — R531 Weakness: Secondary | ICD-10-CM | POA: Insufficient documentation

## 2017-07-15 DIAGNOSIS — R42 Dizziness and giddiness: Secondary | ICD-10-CM | POA: Insufficient documentation

## 2017-07-15 HISTORY — DX: Essential (primary) hypertension: I10

## 2017-07-15 LAB — BASIC METABOLIC PANEL
Anion gap: 10 (ref 5–15)
BUN: 14 mg/dL (ref 6–20)
CO2: 27 mmol/L (ref 22–32)
CREATININE: 0.96 mg/dL (ref 0.44–1.00)
Calcium: 9.7 mg/dL (ref 8.9–10.3)
Chloride: 104 mmol/L (ref 101–111)
GFR calc Af Amer: >60 mL/min (ref >60)
Glucose, Bld: 100 mg/dL — ABNORMAL HIGH (ref 65–99)
Potassium: 3.8 mmol/L (ref 3.5–5.1)
SODIUM: 141 mmol/L (ref 135–145)

## 2017-07-15 LAB — I-STAT BETA HCG BLOOD, ED (MC, WL, AP ONLY)

## 2017-07-15 LAB — CBC
HCT: 46.1 % — ABNORMAL HIGH (ref 36.0–46.0)
Hemoglobin: 15 g/dL (ref 12.0–15.0)
MCH: 29.1 pg (ref 26.0–34.0)
MCHC: 32.5 g/dL (ref 30.0–36.0)
MCV: 89.5 fL (ref 78.0–100.0)
Platelets: 359 10*3/uL (ref 150–400)
RBC: 5.15 MIL/uL — ABNORMAL HIGH (ref 3.87–5.11)
RDW: 13.1 % (ref 11.5–15.5)
WBC: 8.5 10*3/uL (ref 4.0–10.5)

## 2017-07-15 LAB — I-STAT TROPONIN, ED: Troponin i, poc: 0 ng/mL (ref 0.00–0.08)

## 2017-07-15 NOTE — ED Triage Notes (Signed)
Pt arrives via POV from work with lightheaded, SOB, left arm pain/numbness for the last hour. Denies CP, fever. Pt hypertensive in triage.

## 2017-07-15 NOTE — ED Notes (Signed)
Called for vitals in waiting room with no answer

## 2018-09-11 IMAGING — US ULTRASOUND LEFT BREAST LIMITED
1 series · 1 of 1 positions shown · non-contrast
Comparison: Previous exam(s).

CLINICAL DATA: The patient was called back for left breast mass

EXAM:
DIGITAL DIAGNOSTIC LEFT MAMMOGRAM WITH TOMO
ULTRASOUND LEFT BREAST

[Series 1: ultrasound left breast limited · 0.07mm/px · 1 of 1 slices shown]
[im 1/1]
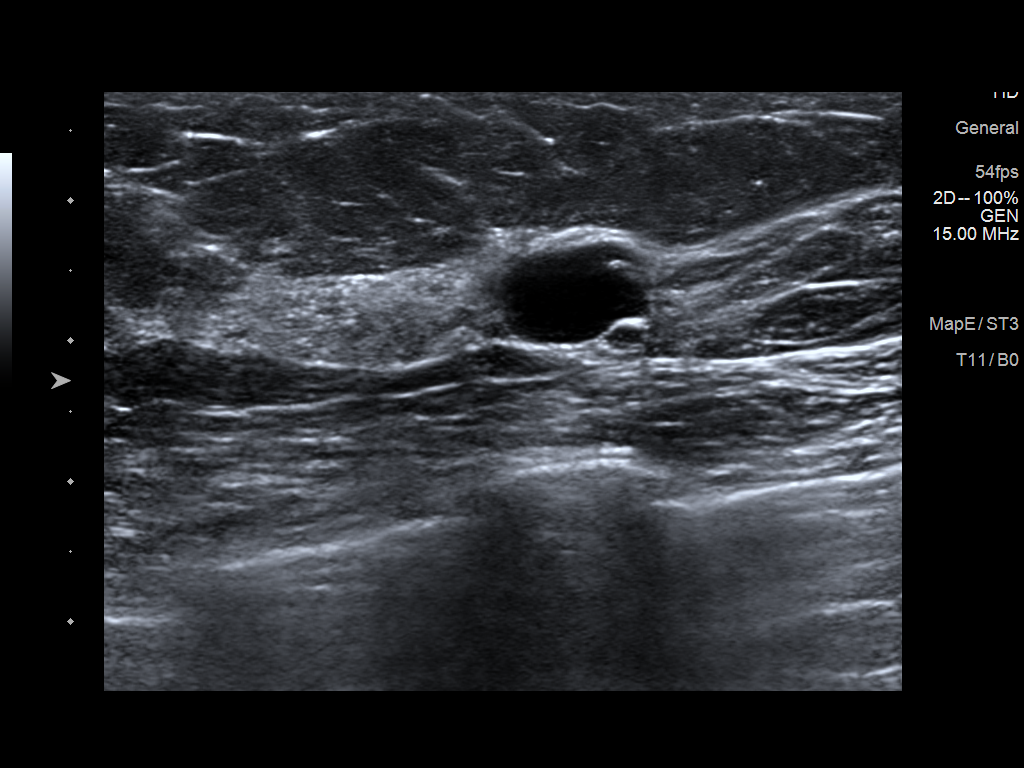

[1 of 1 positions shown; findings below may reference images not displayed]

ACR Breast Density Category b: There are scattered areas of
fibroglandular density.
FINDINGS: The mass in the upper outer left breast persists on additional
imaging. It is oval and isodense.

On physical exam, no suspicious lumps are identified.

Targeted ultrasound is performed, showing numerous cysts including a
dominant cyst at 2:30 o'clock accounting for the mammographic
findings.
IMPRESSION: No mammographic or sonographic evidence of malignancy. Fibrocystic
changes.

RECOMMENDATION:
Annual screening mammography.

I have discussed the findings and recommendations with the patient.
Results were also provided in writing at the conclusion of the
visit. If applicable, a reminder letter will be sent to the patient
regarding the next appointment.

BI-RADS CATEGORY  2: Benign.

## 2018-09-11 IMAGING — MG DIGITAL DIAGNOSTIC UNILATERAL LEFT MAMMOGRAM WITH TOMO AND CAD
4 series · 4 of 12 positions shown · non-contrast
Comparison: Previous exam(s).

CLINICAL DATA: The patient was called back for left breast mass

EXAM:
DIGITAL DIAGNOSTIC LEFT MAMMOGRAM WITH TOMO
ULTRASOUND LEFT BREAST

[L CC synth-2D]
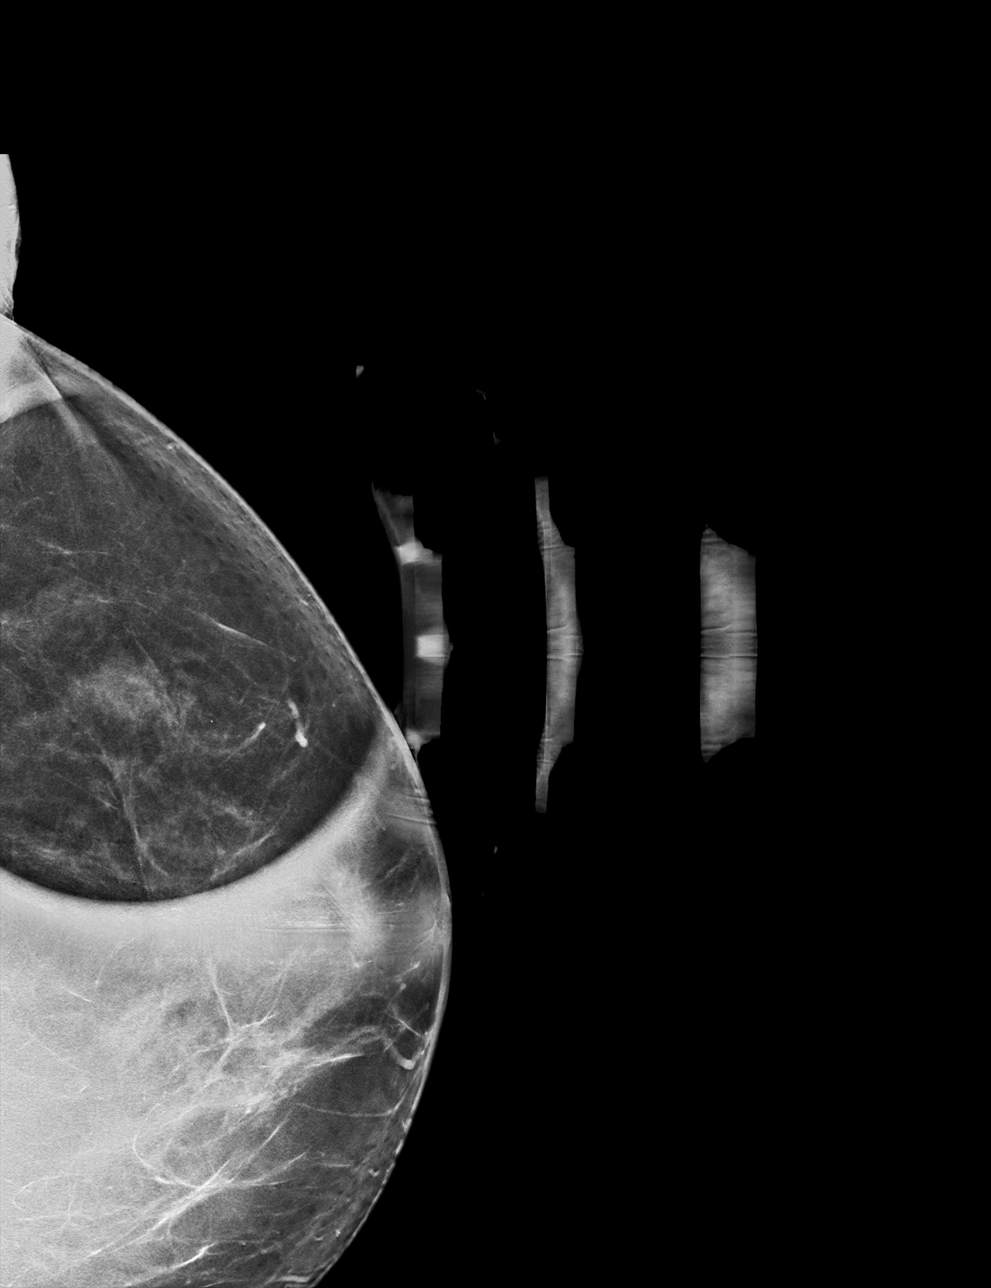

[L MLO synth-2D]
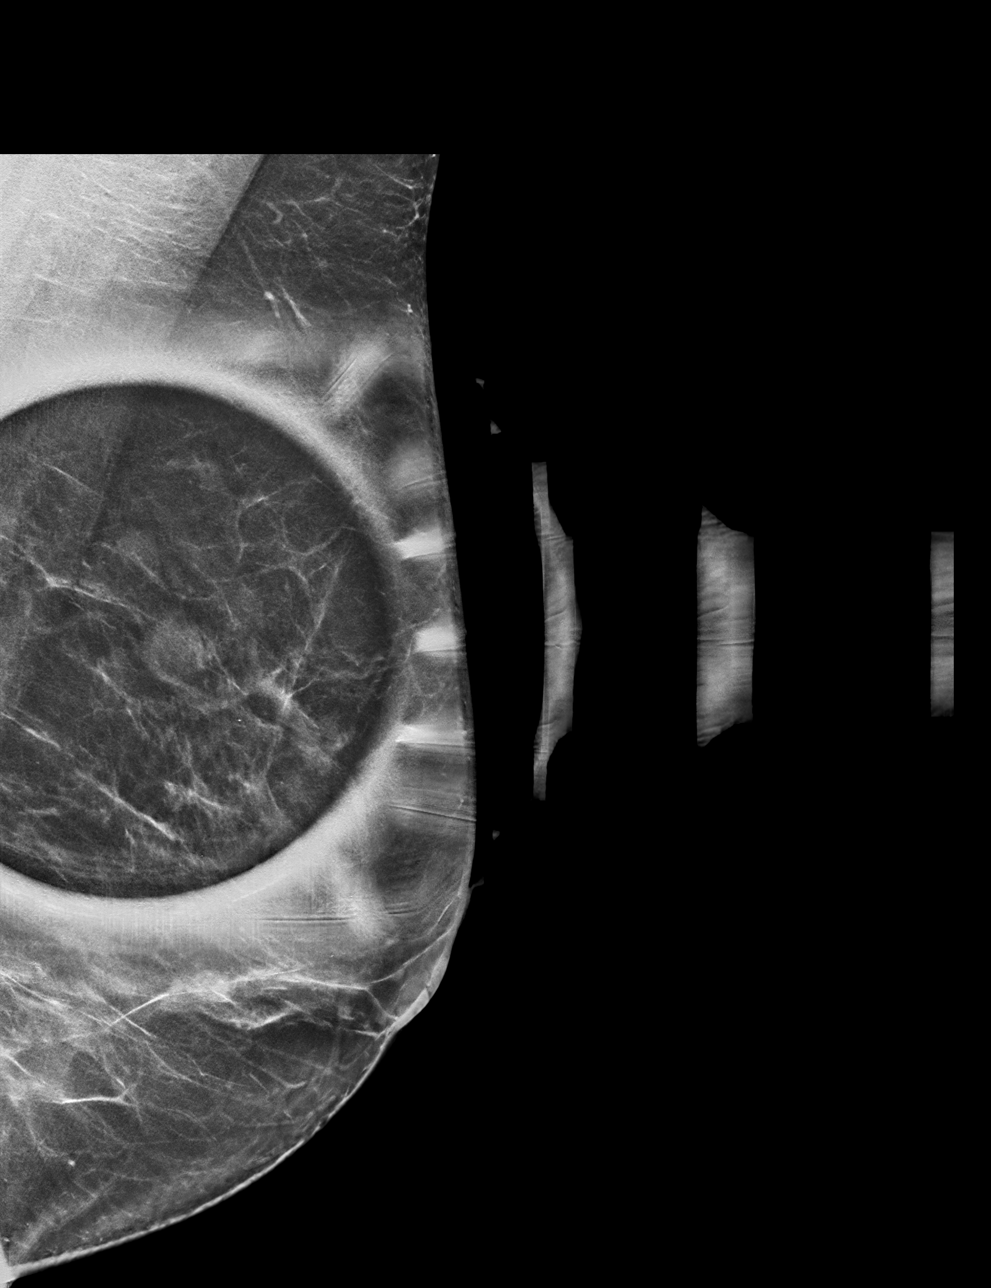

[L CC tomo · tomo slice 35/69.0]
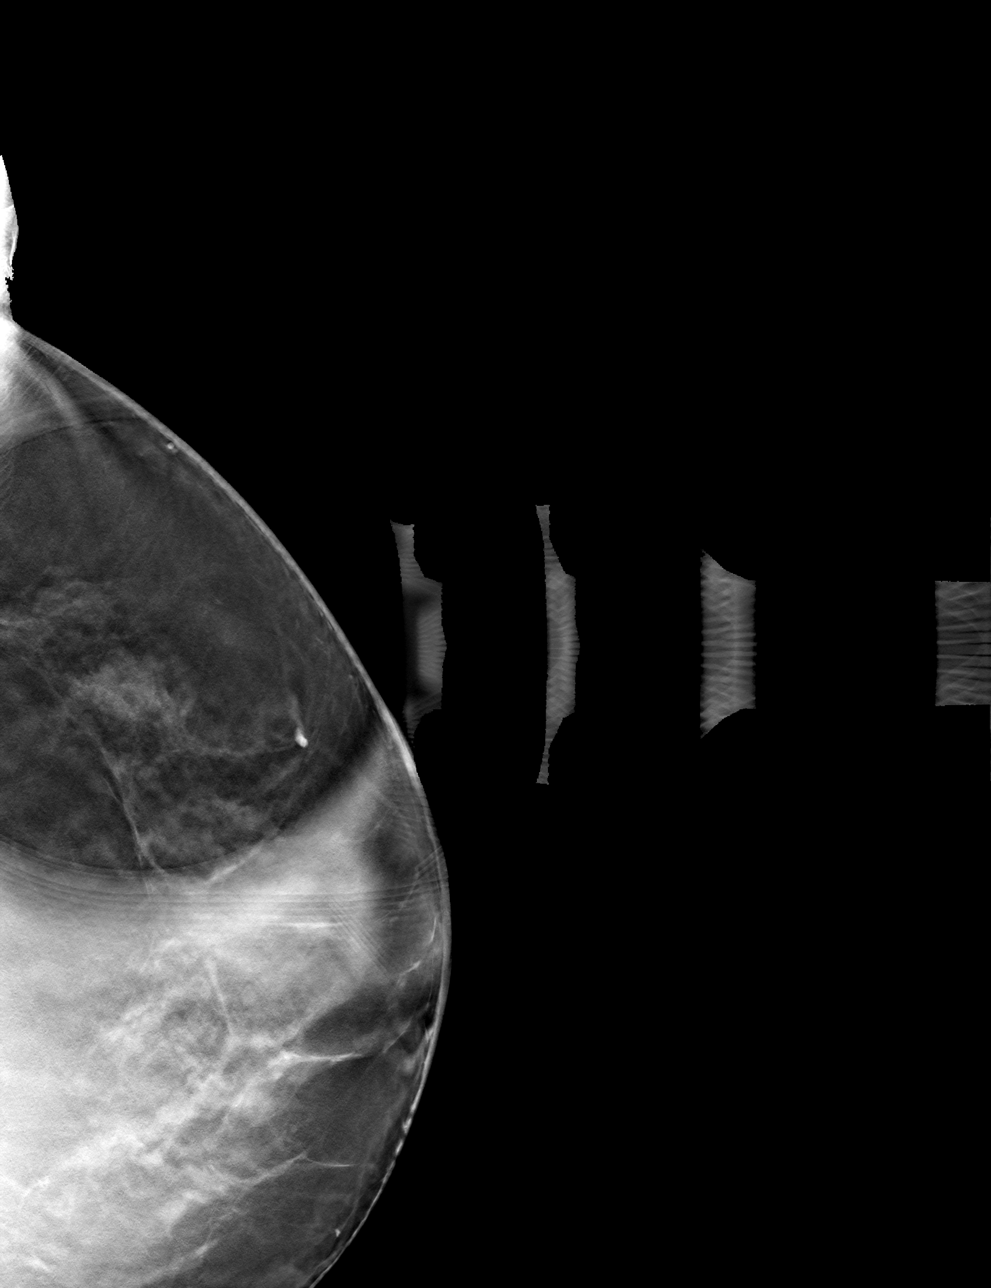

[L MLO tomo · tomo slice 35/68.0]
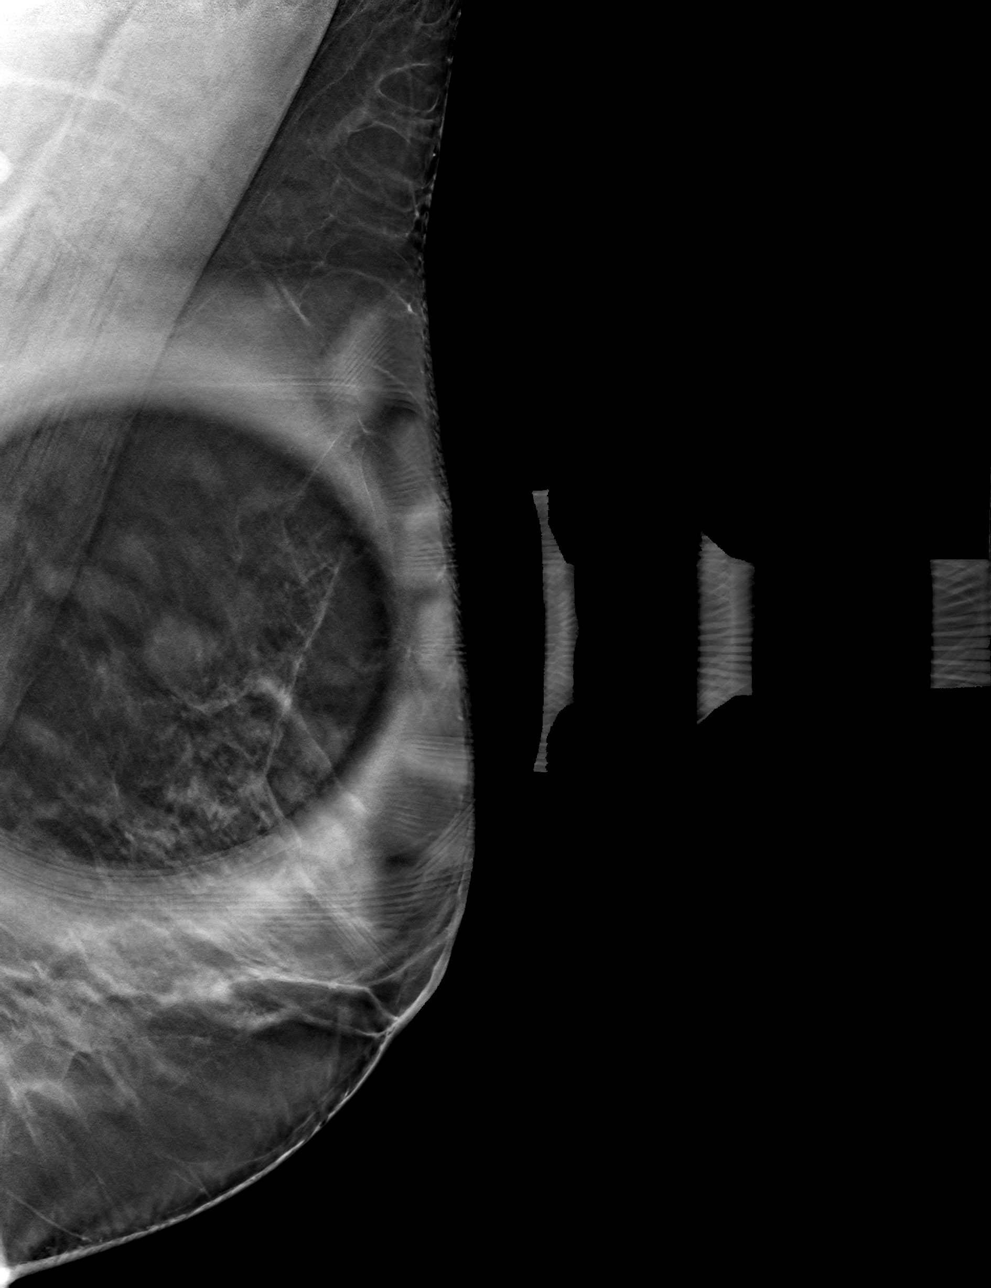

[4 of 12 positions shown; findings below may reference images not displayed]

ACR Breast Density Category b: There are scattered areas of
fibroglandular density.
FINDINGS: The mass in the upper outer left breast persists on additional
imaging. It is oval and isodense.

On physical exam, no suspicious lumps are identified.

Targeted ultrasound is performed, showing numerous cysts including a
dominant cyst at 2:30 o'clock accounting for the mammographic
findings.
IMPRESSION: No mammographic or sonographic evidence of malignancy. Fibrocystic
changes.

RECOMMENDATION:
Annual screening mammography.

I have discussed the findings and recommendations with the patient.
Results were also provided in writing at the conclusion of the
visit. If applicable, a reminder letter will be sent to the patient
regarding the next appointment.

BI-RADS CATEGORY  2: Benign.

## 2018-10-25 IMAGING — DX DG CHEST 2V
2 series · 2 of 2 positions shown · non-contrast
Comparison: Chest x-ray of 05/01/2013

CLINICAL DATA: Left arm pain, nausea, short of breath for 2 hours

EXAM:
CHEST - 2 VIEW

[chest pa]
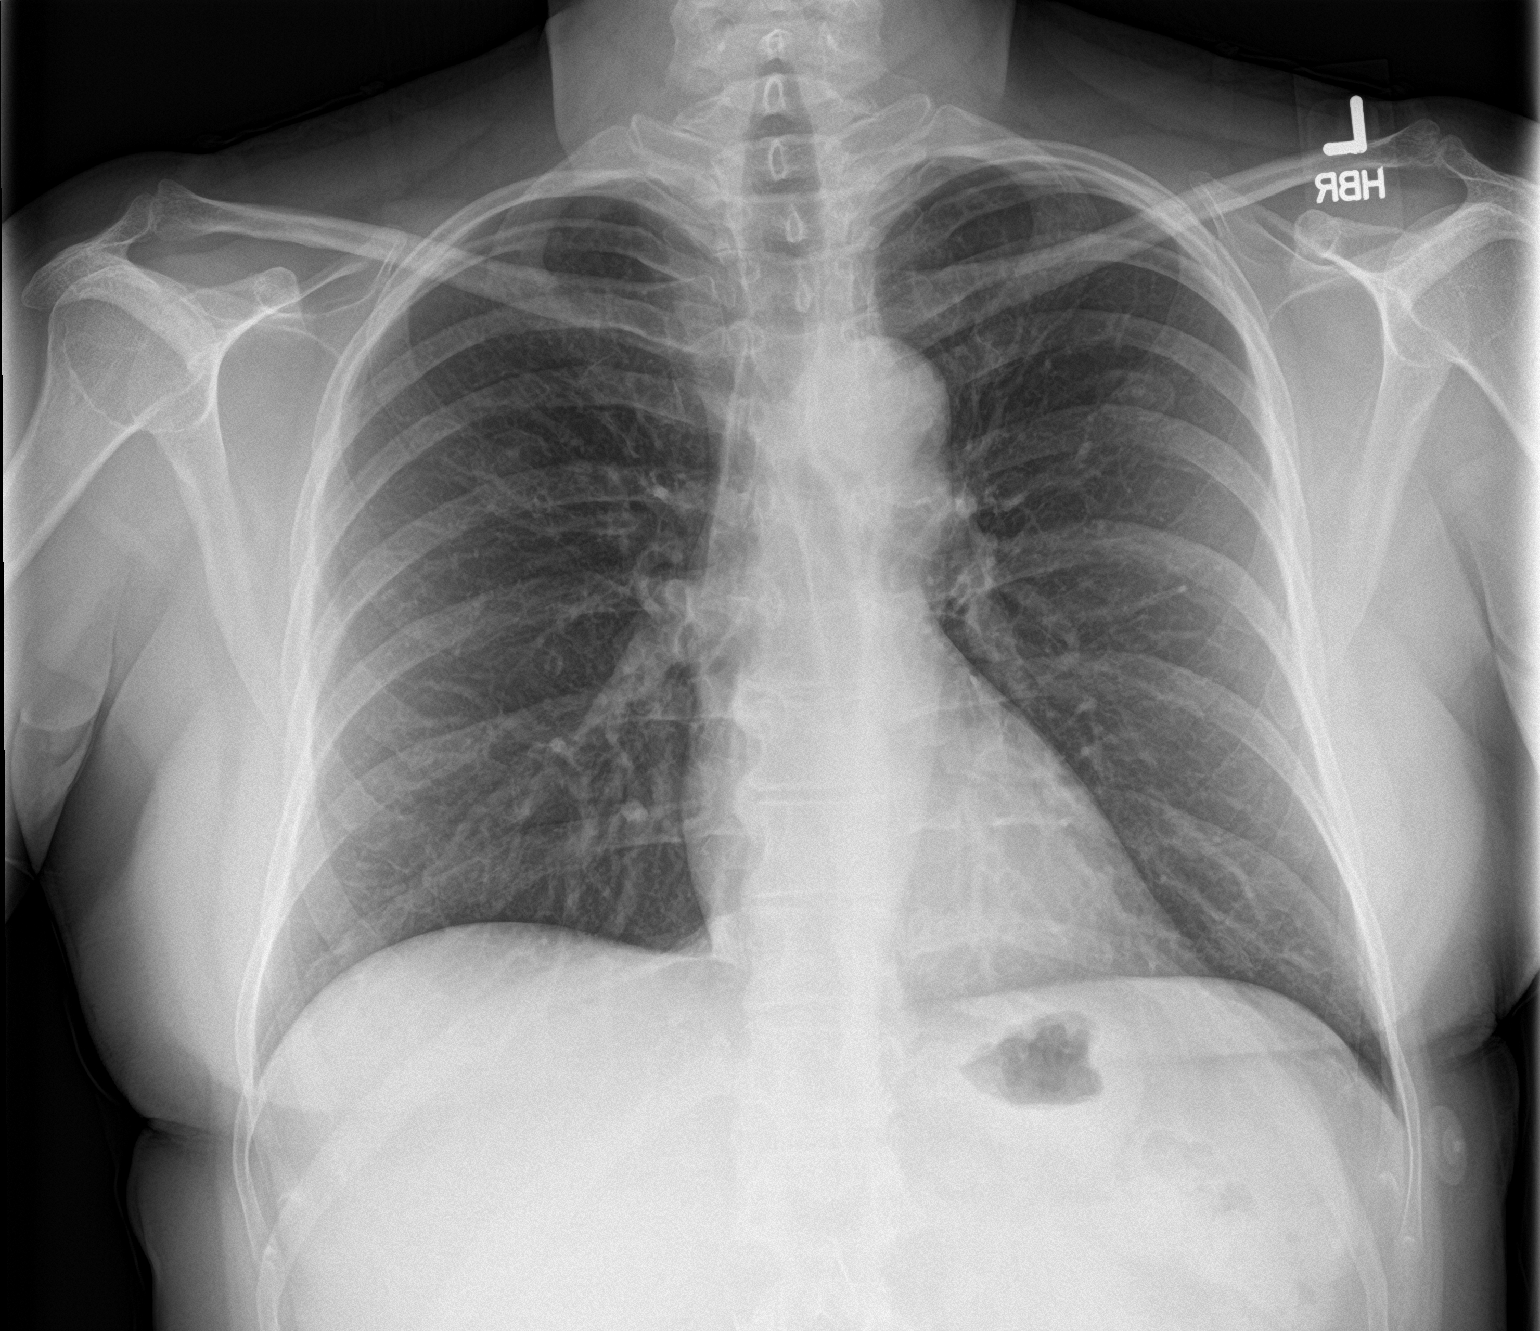

[chest lat]
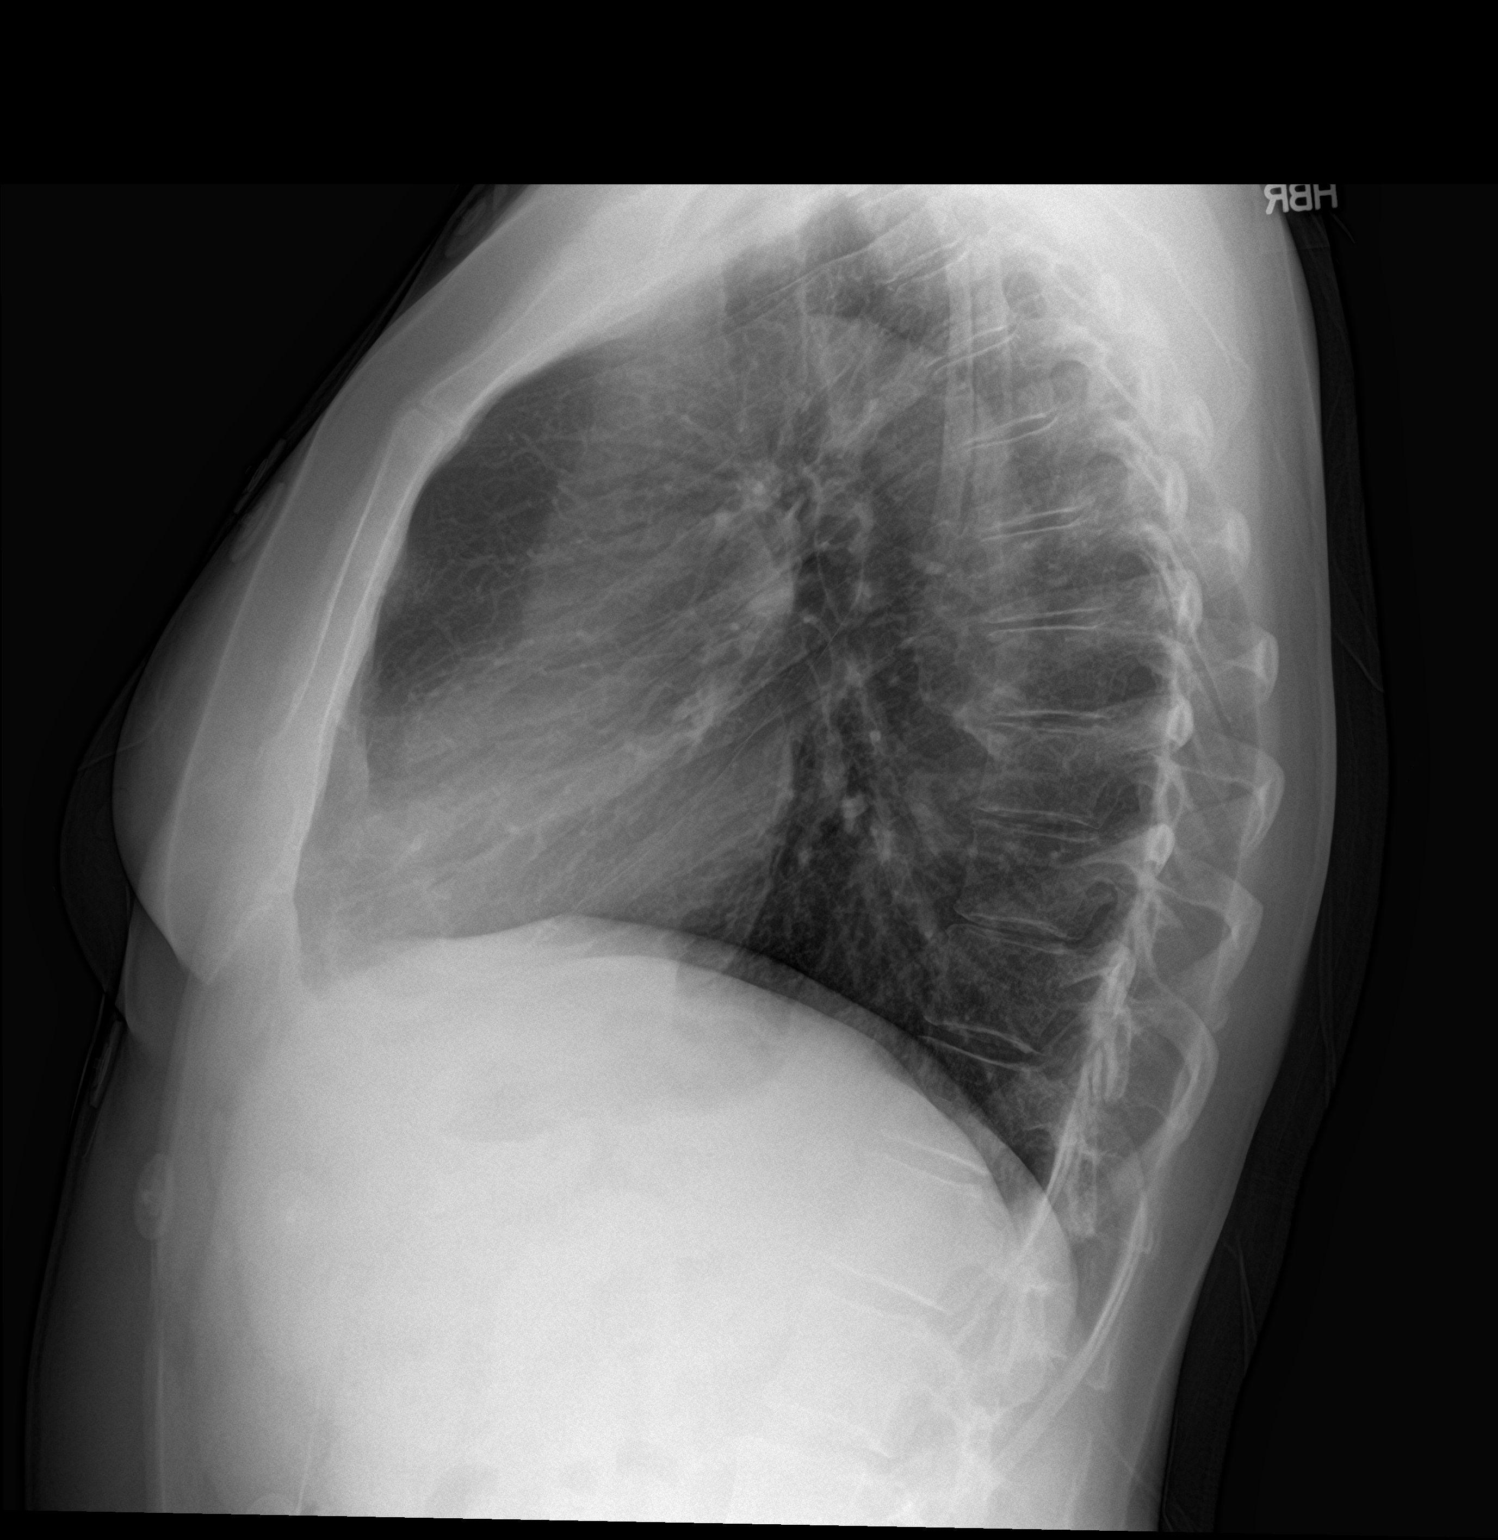

[2 of 2 positions shown; findings below may reference images not displayed]

FINDINGS: No active infiltrate or effusion is seen. Mediastinal and hilar
contours are unremarkable. The heart is within normal limits in
size. No bony abnormality is seen.
IMPRESSION: No active cardiopulmonary disease.

## 2021-12-29 ENCOUNTER — Encounter: Payer: Self-pay | Admitting: Gastroenterology

## 2022-01-01 ENCOUNTER — Encounter: Payer: Self-pay | Admitting: Gastroenterology

## 2022-01-01 ENCOUNTER — Other Ambulatory Visit (INDEPENDENT_AMBULATORY_CARE_PROVIDER_SITE_OTHER): Payer: BC Managed Care – PPO

## 2022-01-01 ENCOUNTER — Ambulatory Visit: Payer: BC Managed Care – PPO | Admitting: Gastroenterology

## 2022-01-01 VITALS — BP 130/82 | HR 85 | Ht 66.0 in | Wt 164.0 lb

## 2022-01-01 DIAGNOSIS — K760 Fatty (change of) liver, not elsewhere classified: Secondary | ICD-10-CM

## 2022-01-01 DIAGNOSIS — R160 Hepatomegaly, not elsewhere classified: Secondary | ICD-10-CM | POA: Diagnosis not present

## 2022-01-01 DIAGNOSIS — R748 Abnormal levels of other serum enzymes: Secondary | ICD-10-CM | POA: Diagnosis not present

## 2022-01-01 LAB — HEPATIC FUNCTION PANEL
ALT: 21 U/L (ref 0–35)
AST: 16 U/L (ref 0–37)
Albumin: 4.7 g/dL (ref 3.5–5.2)
Alkaline Phosphatase: 108 U/L (ref 39–117)
Bilirubin, Direct: 0.2 mg/dL (ref 0.0–0.3)
Total Bilirubin: 1 mg/dL (ref 0.2–1.2)
Total Protein: 7.9 g/dL (ref 6.0–8.3)

## 2022-01-01 LAB — PROTIME-INR
INR: 1.1 ratio — ABNORMAL HIGH (ref 0.8–1.0)
Prothrombin Time: 12.2 s (ref 9.6–13.1)

## 2022-01-01 LAB — CK: Total CK: 75 U/L (ref 7–177)

## 2022-01-05 ENCOUNTER — Other Ambulatory Visit: Payer: Self-pay

## 2022-01-05 ENCOUNTER — Telehealth: Payer: Self-pay | Admitting: Gastroenterology

## 2022-01-05 DIAGNOSIS — Z86010 Personal history of colonic polyps: Secondary | ICD-10-CM

## 2022-01-05 DIAGNOSIS — R748 Abnormal levels of other serum enzymes: Secondary | ICD-10-CM

## 2022-01-05 LAB — HEPATITIS B SURFACE ANTIGEN: Hepatitis B Surface Ag: NONREACTIVE

## 2022-01-05 LAB — HEPATITIS A ANTIBODY, TOTAL: Hepatitis A AB,Total: NONREACTIVE

## 2022-01-05 LAB — HEPATITIS C ANTIBODY: Hepatitis C Ab: NONREACTIVE

## 2022-01-05 LAB — IGG: IgG (Immunoglobin G), Serum: 1178 mg/dL (ref 600–1640)

## 2022-01-05 LAB — HEPATITIS B SURFACE ANTIBODY,QUALITATIVE: Hep B S Ab: NONREACTIVE

## 2022-01-05 LAB — ANA: Anti Nuclear Antibody (ANA): NEGATIVE

## 2022-01-05 LAB — ANTI-SMOOTH MUSCLE ANTIBODY, IGG: Actin (Smooth Muscle) Antibody (IGG): <20 U (ref <20)

## 2022-01-05 LAB — ALPHA-1-ANTITRYPSIN: A-1 Antitrypsin, Ser: 106 mg/dL (ref 83–199)

## 2022-01-05 LAB — CERULOPLASMIN: Ceruloplasmin: 31 mg/dL (ref 18–53)

## 2022-01-07 MED ORDER — NA SULFATE-K SULFATE-MG SULF 17.5-3.13-1.6 GM/177ML PO SOLN: 1.0000 | ORAL | 0 refills | Status: DC

## 2022-03-04 ENCOUNTER — Encounter: Payer: Self-pay | Admitting: Certified Registered Nurse Anesthetist

## 2022-03-06 ENCOUNTER — Encounter: Payer: Self-pay | Admitting: Gastroenterology

## 2022-03-09 ENCOUNTER — Encounter: Payer: Self-pay | Admitting: Gastroenterology

## 2022-03-09 ENCOUNTER — Ambulatory Visit (AMBULATORY_SURGERY_CENTER): Payer: BC Managed Care – PPO | Admitting: Gastroenterology

## 2022-03-09 VITALS — BP 124/84 | HR 71 | Temp 98.0°F | Resp 15 | Ht 66.0 in | Wt 164.0 lb

## 2022-03-09 DIAGNOSIS — Z09 Encounter for follow-up examination after completed treatment for conditions other than malignant neoplasm: Secondary | ICD-10-CM | POA: Diagnosis not present

## 2022-03-09 DIAGNOSIS — K635 Polyp of colon: Secondary | ICD-10-CM | POA: Diagnosis not present

## 2022-03-09 DIAGNOSIS — D122 Benign neoplasm of ascending colon: Secondary | ICD-10-CM

## 2022-03-09 DIAGNOSIS — Z86010 Personal history of colonic polyps: Secondary | ICD-10-CM

## 2022-03-09 DIAGNOSIS — D125 Benign neoplasm of sigmoid colon: Secondary | ICD-10-CM

## 2022-03-09 MED ORDER — SODIUM CHLORIDE 0.9 % IV SOLN: 500.0000 mL | INTRAVENOUS | Status: DC

## 2022-03-09 NOTE — Progress Notes (Signed)
[facility] History and Physical   Primary Care Physician:  Miranda Butcher, NP   Reason for Procedure:   History of colon polyps  Plan:    colonoscopy     HPI: Miranda Huff is a 57 y.o. female  here for colonoscopy surveillance - 40m SSP removed 06/2016 Dr. MEarlean Huff   Patient denies any bowel symptoms at this time. No family history of colon cancer known. Otherwise feels well without any cardiopulmonary symptoms.   I have discussed risks / benefits of anesthesia and endoscopic procedure with Miranda Huff they wish to proceed with the exams as outlined today.    Past Medical History:  Diagnosis Date   ADHD (attention deficit hyperactivity disorder)    Binge eating disorder    Depression    GERD (gastroesophageal reflux disease)    HLD (hyperlipidemia)    Hypertension    Myocardial infarction ([facility]     Past Surgical History:  Procedure Laterality Date   BREAST LUMPECTOMY     COLONOSCOPY  2017   LAPAROSCOPIC HYSTERECTOMY      Prior to Admission medications   Medication Sig Start Date End Date Taking? Authorizing Provider  cyanocobalamin (VITAMIN B12) 1000 MCG/ML injection Inject 1,000 mcg into the muscle once a week. 01/21/22  Yes [provider]  lisdexamfetamine (VYVANSE) 70 MG capsule Take 70 mg by mouth every morning. 12/29/21  Yes [provider]    Current Outpatient Medications  Medication Sig Dispense Refill   cyanocobalamin (VITAMIN B12) 1000 MCG/ML injection Inject 1,000 mcg into the muscle once a week.     lisdexamfetamine (VYVANSE) 70 MG capsule Take 70 mg by mouth every morning.     Current Facility-Administered Medications  Medication Dose Route Frequency Provider Last Rate Last Admin   0.9 %  sodium chloride infusion  500 mL Intravenous Continuous Miranda Huff, Miranda Raspberry MD        Allergies as of 03/09/2022   (No Known Allergies)    Family History  Problem Relation Age of Onset   Kidney cancer Mother    Heart  attack Father    Colon cancer Neg Hx    Stomach cancer Neg Hx    Throat cancer Neg Hx    Rectal cancer Neg Hx     Social History   Socioeconomic History   Marital status: Legally Separated    Spouse name: Not on file   Number of children: Not on file   Years of education: Not on file   Highest education level: Not on file  Occupational History   Not on file  Tobacco Use   Smoking status: Never   Smokeless tobacco: Never  Vaping Use   Vaping Use: Never used  Substance and Sexual Activity   Alcohol use: Yes    Comment: less than 1 drink a week   Drug use: Never   Sexual activity: Not on file  Other Topics Concern   Not on file  Social History Narrative   Not on file   Social Determinants of Health   Financial Resource Strain: Not on file  Food Insecurity: Not on file  Transportation Needs: Not on file  Physical Activity: Not on file  Stress: Not on file  Social Connections: Not on file  Intimate Partner Violence: Not on file    Review of Systems: All other review of systems negative except as mentioned in the HPI.  Physical Exam: Vital signs BP (!) 142/94   Pulse 75   Temp 98 F (36.7  C) (Temporal)   Resp 14   Ht '5\' 6"'$  (1.676 m)   Wt 164 lb (74.4 kg)   SpO2 98%   BMI 26.47 kg/m   General:   Alert,  Well-developed, pleasant and cooperative in NAD Lungs:  Clear throughout to auscultation.   Heart:  Regular rate and rhythm Abdomen:  Soft, nontender and nondistended.   Neuro/Psych:  Alert and cooperative. Normal mood and affect. A and O x 3  Miranda Mango, MD [facility]

## 2022-03-09 NOTE — Progress Notes (Signed)
Report given to PACU, vss 

## 2022-03-09 NOTE — Progress Notes (Signed)
Called to room to assist during endoscopic procedure.  Patient ID and intended procedure confirmed with present staff. Received instructions for my participation in the procedure from the performing physician.

## 2022-03-09 NOTE — Patient Instructions (Signed)
Handouts/information given for polyps, hemorrhoids and diverticulosis  YOU HAD AN ENDOSCOPIC PROCEDURE TODAY AT [facility]:   Refer to the procedure report that was given to you for any specific questions about what was found during the examination.  If the procedure report does not answer your questions, please call your gastroenterologist to clarify.  If you requested that your care partner not be given the details of your procedure findings, then the procedure report has been included in a sealed envelope for you to review at your convenience later.  YOU SHOULD EXPECT: Some feelings of bloating in the abdomen. Passage of more gas than usual.  Walking can help get rid of the air that was put into your GI tract during the procedure and reduce the bloating. If you had a lower endoscopy (such as a colonoscopy or flexible sigmoidoscopy) you may notice spotting of blood in your stool or on the toilet paper. If you underwent a bowel prep for your procedure, you may not have a normal bowel movement for a few days.  Please Note:  You might notice some irritation and congestion in your nose or some drainage.  This is from the oxygen used during your procedure.  There is no need for concern and it should clear up in a day or so.  SYMPTOMS TO REPORT IMMEDIATELY:  Following lower endoscopy (colonoscopy):  Excessive amounts of blood in the stool  Significant tenderness or worsening of abdominal pains  Swelling of the abdomen that is new, acute  Fever of 100F or higher  For urgent or emergent issues, a gastroenterologist can be reached at any hour by calling [phone]. Do not use MyChart messaging for urgent concerns.    DIET:  We do recommend a small meal at first, but then you may proceed to your regular diet.  Drink plenty of fluids but you should avoid alcoholic beverages for 24 hours.  ACTIVITY:  You should plan to take it easy for the rest of today and you should NOT DRIVE or  use heavy machinery until tomorrow (because of the sedation medicines used during the test).    FOLLOW UP: Our staff will call the number listed on your records the next business day following your procedure.  We will call around 7:15- 8:00 am to check on you and address any questions or concerns that you may have regarding the information given to you following your procedure. If we do not reach you, we will leave a message.     If any biopsies were taken you will be contacted by phone or by letter within the next 1-3 weeks.  Please call us at [phone] if you have not heard about the biopsies in 3 weeks.    SIGNATURES/CONFIDENTIALITY: You and/or your care partner have signed paperwork which will be entered into your electronic medical record.  These signatures attest to the fact that that the information above on your After Visit Summary has been reviewed and is understood.  Full responsibility of the confidentiality of this discharge information lies with you and/or your care-partner.

## 2022-03-09 NOTE — Op Note (Signed)
[facility] Patient Name: Miranda Huff Procedure Date: 03/09/2022 8:23 AM MRN: [id number] Endoscopist: Remo Lipps P. Havery Moros , MD, [id number] Age: 57 Referring MD:  Date of Birth: Aug 09, 1965 Gender: Female Account #: [id number] Procedure:                Colonoscopy Indications:              High risk colon cancer surveillance: Personal                            history of colonic polyps - 06/2016 - 13m cecal SSP                            (Dr. MEarlean Shawl Medicines:                Monitored Anesthesia Care Procedure:                Pre-Anesthesia Assessment:                           - Prior to the procedure, a History and Physical                            was performed, and patient medications and                            allergies were reviewed. The patient's tolerance of                            previous anesthesia was also reviewed. The risks                            and benefits of the procedure and the sedation                            options and risks were discussed with the patient.                            All questions were answered, and informed consent                            was obtained. Prior Anticoagulants: The patient has                            taken no anticoagulant or antiplatelet agents. ASA                            Grade Assessment: II - A patient with mild systemic                            disease. After reviewing the risks and benefits,                            the patient was deemed in satisfactory condition to  undergo the procedure.                           After obtaining informed consent, the colonoscope                            was passed under direct vision. Throughout the                            procedure, the patient's blood pressure, pulse, and                            oxygen saturations were monitored continuously. The                            Olympus Scope [id number] was introduced  through the                            anus and advanced to the the cecum, identified by                            appendiceal orifice and ileocecal valve. The                            colonoscopy was performed without difficulty. The                            patient tolerated the procedure well. The quality                            of the bowel preparation was good. The ileocecal                            valve, appendiceal orifice, and rectum were                            photographed. Scope In: 8:36:06 AM Scope Out: 8:53:14 AM Scope Withdrawal Time: 0 hours 12 minutes 2 seconds  Total Procedure Duration: 0 hours 17 minutes 8 seconds  Findings:                 The perianal and digital rectal examinations were                            normal.                           Multiple small-mouthed diverticula were found in                            the sigmoid colon, descending colon and transverse                            colon.  A 6 to 7 mm polyp was found in the ascending colon.                            The polyp was flat. The polyp was removed with a                            cold snare. Resection and retrieval were complete.                           A diminutive polyp was found in the sigmoid colon.                            The polyp was sessile. The polyp was removed with a                            cold snare. Resection and retrieval were complete.                           Internal hemorrhoids were found during                            retroflexion. The hemorrhoids were small.                           The exam was otherwise without abnormality. Complications:            No immediate complications. Estimated blood loss:                            Minimal. Estimated Blood Loss:     Estimated blood loss was minimal. Impression:               - Diverticulosis in the sigmoid colon, in the                            descending colon and in  the transverse colon.                           - One 6 to 7 mm polyp in the ascending colon,                            removed with a cold snare. Resected and retrieved.                           - One diminutive polyp in the sigmoid colon,                            removed with a cold snare. Resected and retrieved.                           - Internal hemorrhoids.                           - The examination was  otherwise normal. Recommendation:           - Patient has a contact number available for                            emergencies. The signs and symptoms of potential                            delayed complications were discussed with the                            patient. Return to normal activities tomorrow.                            Written discharge instructions were provided to the                            patient.                           - Resume previous diet.                           - Continue present medications.                           - Await pathology results. Remo Lipps P. Joyelle Siedlecki, MD 03/09/2022 8:57:00 AM This report has been signed electronically.

## 2022-03-10 ENCOUNTER — Telehealth: Payer: Self-pay

## 2022-03-10 NOTE — Telephone Encounter (Signed)
  Follow up Call-     03/09/2022    7:49 AM  Call back number  Post procedure Call Back phone  # [phone]  Permission to leave phone message Yes     Patient questions:  Do you have a fever, pain , or abdominal swelling? No. Pain Score  0 *  Have you tolerated food without any problems? Yes.    Have you been able to return to your normal activities? Yes.    Do you have any questions about your discharge instructions: Diet   No. Medications  No. Follow up visit  No.  Do you have questions or concerns about your Care? No.  Actions: * If pain score is 4 or above: No action needed, pain <4.

## 2022-07-06 ENCOUNTER — Telehealth: Payer: Self-pay

## 2022-07-06 NOTE — Telephone Encounter (Signed)
MyChart message sent to patient with lab reminder.  

## 2022-07-06 NOTE — Telephone Encounter (Signed)
-----   Message from Chrystie Nose, RN sent at 01/05/2022  8:43 AM EST ----- Regarding: LFT's Pt needs LFT's done in 6 mths. Order in epic.

## 2022-07-10 NOTE — Telephone Encounter (Signed)
Patient reviewed and responded to MyChart message.  Last read by Barton Dubois at  1:57 PM on 07/07/2022.

## 2022-12-25 ENCOUNTER — Encounter: Payer: Self-pay | Admitting: Gastroenterology
